# Patient Record
Sex: Male | Born: 2000 | Race: White | Hispanic: No | Marital: Single | Smoking: Current every day smoker
Health system: Southern US, Community
[De-identification: ages and names within clinical notes are randomized; demographics above are authoritative.]

---
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
## 2021-05-14 ENCOUNTER — Other Ambulatory Visit: Payer: Self-pay

## 2021-05-14 ENCOUNTER — Encounter (HOSPITAL_BASED_OUTPATIENT_CLINIC_OR_DEPARTMENT_OTHER): Payer: Self-pay | Admitting: Obstetrics and Gynecology

## 2021-05-14 ENCOUNTER — Emergency Department (HOSPITAL_BASED_OUTPATIENT_CLINIC_OR_DEPARTMENT_OTHER): Payer: 59 | Admitting: Radiology

## 2021-05-14 ENCOUNTER — Emergency Department (HOSPITAL_BASED_OUTPATIENT_CLINIC_OR_DEPARTMENT_OTHER)
Admission: EM | Admit: 2021-05-14 | Discharge: 2021-05-14 | Disposition: A | Discharge status: Home / Self Care | Payer: 59 | Attending: Emergency Medicine | Admitting: Emergency Medicine

## 2021-05-14 DIAGNOSIS — S93402A Sprain of unspecified ligament of left ankle, initial encounter: Secondary | ICD-10-CM

## 2021-05-14 DIAGNOSIS — F17290 Nicotine dependence, other tobacco product, uncomplicated: Secondary | ICD-10-CM | POA: Insufficient documentation

## 2021-05-14 DIAGNOSIS — S42002A Fracture of unspecified part of left clavicle, initial encounter for closed fracture: Secondary | ICD-10-CM

## 2021-05-14 DIAGNOSIS — S42022A Displaced fracture of shaft of left clavicle, initial encounter for closed fracture: Secondary | ICD-10-CM | POA: Diagnosis not present

## 2021-05-14 DIAGNOSIS — S99912A Unspecified injury of left ankle, initial encounter: Secondary | ICD-10-CM | POA: Diagnosis not present

## 2021-05-14 DIAGNOSIS — S4992XA Unspecified injury of left shoulder and upper arm, initial encounter: Secondary | ICD-10-CM | POA: Diagnosis present

## 2021-05-14 MED ORDER — HYDROCODONE-ACETAMINOPHEN 5-325 MG PO TABS
2.0000 | ORAL_TABLET | Freq: Once | ORAL | Status: AC
  Administered 2021-05-14: 2 via ORAL
  Filled 2021-05-14: qty 2

## 2021-05-14 MED ORDER — HYDROCODONE-ACETAMINOPHEN 7.5-325 MG PO TABS: 1.0000 | ORAL_TABLET | Freq: Four times a day (QID) | ORAL | 0 refills | Status: DC | PRN

## 2021-05-14 MED ORDER — HYDROCODONE-ACETAMINOPHEN 7.5-325 MG PO TABS
1.0000 | ORAL_TABLET | Freq: Once | ORAL | Status: DC
  Filled 2021-05-14: qty 1
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
## 2021-05-16 ENCOUNTER — Ambulatory Visit (INDEPENDENT_AMBULATORY_CARE_PROVIDER_SITE_OTHER): Payer: 59 | Admitting: Orthopaedic Surgery

## 2021-05-16 ENCOUNTER — Other Ambulatory Visit: Payer: Self-pay

## 2021-05-16 VITALS — Ht 74.0 in | Wt 260.0 lb

## 2021-05-16 DIAGNOSIS — S42022A Displaced fracture of shaft of left clavicle, initial encounter for closed fracture: Secondary | ICD-10-CM | POA: Diagnosis not present
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
## 2021-05-19 ENCOUNTER — Telehealth: Payer: Self-pay | Admitting: Orthopaedic Surgery

## 2021-05-19 ENCOUNTER — Other Ambulatory Visit: Payer: Self-pay | Admitting: Physician Assistant

## 2021-05-19 MED ORDER — HYDROCODONE-ACETAMINOPHEN 7.5-325 MG PO TABS: 1.0000 | ORAL_TABLET | Freq: Four times a day (QID) | ORAL | 0 refills | Status: AC | PRN

## 2021-05-19 NOTE — Telephone Encounter (Signed)
Sent in refill.  Please have them call the hospital as meds taken day of surgery are up to anesthesia

## 2021-05-19 NOTE — Telephone Encounter (Signed)
Patient's father Arlys John called advised patient need Rx refilled Hydrocodone. Arlys John said patient has 3 tabs left.  Arlys John asked if patient can take Hydrocodone the day of the surgery? The number to contact brian is [phone]

## 2021-05-20 ENCOUNTER — Other Ambulatory Visit: Payer: Self-pay

## 2021-05-20 ENCOUNTER — Encounter (HOSPITAL_COMMUNITY): Payer: Self-pay | Admitting: Orthopaedic Surgery

## 2021-05-20 DIAGNOSIS — S42022A Displaced fracture of shaft of left clavicle, initial encounter for closed fracture: Secondary | ICD-10-CM
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
## 2021-05-20 NOTE — Telephone Encounter (Signed)
Patients dad aware. States no one has called him from Hospital. SU is tomorrow. Can you help with this.

## 2021-05-21 ENCOUNTER — Ambulatory Visit: Payer: 59 | Admitting: Surgery

## 2021-05-21 ENCOUNTER — Ambulatory Visit (HOSPITAL_COMMUNITY): Payer: 59 | Admitting: Vascular Surgery

## 2021-05-21 ENCOUNTER — Encounter (HOSPITAL_COMMUNITY)
Admission: RE | Disposition: A | Discharge status: Home / Self Care | Payer: Self-pay | Source: Home / Self Care | Attending: Orthopaedic Surgery

## 2021-05-21 ENCOUNTER — Ambulatory Visit (HOSPITAL_COMMUNITY)
Admission: RE | Admit: 2021-05-21 | Discharge: 2021-05-21 | Disposition: A | Discharge status: Home / Self Care | Payer: 59 | Attending: Orthopaedic Surgery | Admitting: Orthopaedic Surgery

## 2021-05-21 ENCOUNTER — Ambulatory Visit (HOSPITAL_COMMUNITY): Payer: 59

## 2021-05-21 ENCOUNTER — Encounter (HOSPITAL_COMMUNITY): Payer: Self-pay | Admitting: Orthopaedic Surgery

## 2021-05-21 DIAGNOSIS — S42022A Displaced fracture of shaft of left clavicle, initial encounter for closed fracture: Secondary | ICD-10-CM | POA: Diagnosis not present

## 2021-05-21 DIAGNOSIS — F17290 Nicotine dependence, other tobacco product, uncomplicated: Secondary | ICD-10-CM | POA: Insufficient documentation

## 2021-05-21 DIAGNOSIS — X58XXXA Exposure to other specified factors, initial encounter: Secondary | ICD-10-CM | POA: Insufficient documentation

## 2021-05-21 DIAGNOSIS — S42002A Fracture of unspecified part of left clavicle, initial encounter for closed fracture: Secondary | ICD-10-CM | POA: Insufficient documentation

## 2021-05-21 DIAGNOSIS — Z419 Encounter for procedure for purposes other than remedying health state, unspecified: Secondary | ICD-10-CM

## 2021-05-21 HISTORY — PX: ORIF CLAVICULAR FRACTURE: SHX5055

## 2021-05-21 LAB — COMPREHENSIVE METABOLIC PANEL
ALT: 101 U/L — ABNORMAL HIGH (ref 0–44)
AST: 56 U/L — ABNORMAL HIGH (ref 15–41)
Albumin: 4 g/dL (ref 3.5–5.0)
Alkaline Phosphatase: 49 U/L (ref 38–126)
Anion gap: 9 (ref 5–15)
BUN: 17 mg/dL (ref 6–20)
CO2: 24 mmol/L (ref 22–32)
Calcium: 9.7 mg/dL (ref 8.9–10.3)
Chloride: 100 mmol/L (ref 98–111)
Creatinine, Ser: 0.74 mg/dL (ref 0.61–1.24)
GFR, Estimated: >60 mL/min (ref >60)
Glucose, Bld: 81 mg/dL (ref 70–99)
Potassium: 4.4 mmol/L (ref 3.5–5.1)
Sodium: 133 mmol/L — ABNORMAL LOW (ref 135–145)
Total Bilirubin: 1 mg/dL (ref 0.3–1.2)
Total Protein: 7.2 g/dL (ref 6.5–8.1)

## 2021-05-21 LAB — CBC
HCT: 41.4 % (ref 39.0–52.0)
Hemoglobin: 14.2 g/dL (ref 13.0–17.0)
MCH: 30.5 pg (ref 26.0–34.0)
MCHC: 34.3 g/dL (ref 30.0–36.0)
MCV: 88.8 fL (ref 80.0–100.0)
Platelets: 282 10*3/uL (ref 150–400)
RBC: 4.66 MIL/uL (ref 4.22–5.81)
RDW: 12 % (ref 11.5–15.5)
WBC: 7.7 10*3/uL (ref 4.0–10.5)
nRBC: 0 % (ref 0.0–0.2)

## 2021-05-21 SURGERY — OPEN REDUCTION INTERNAL FIXATION (ORIF) CLAVICULAR FRACTURE
Anesthesia: General | Laterality: Left

## 2021-05-21 MED ORDER — CEFAZOLIN SODIUM-DEXTROSE 2-4 GM/100ML-% IV SOLN
2.0000 g | INTRAVENOUS | Status: AC
  Administered 2021-05-21: 2 g via INTRAVENOUS
  Filled 2021-05-21: qty 100

## 2021-05-21 MED ORDER — ROCURONIUM BROMIDE 10 MG/ML (PF) SYRINGE
PREFILLED_SYRINGE | INTRAVENOUS | Status: DC | PRN
  Administered 2021-05-21: 100 mg via INTRAVENOUS

## 2021-05-21 MED ORDER — PHENYLEPHRINE 40 MCG/ML (10ML) SYRINGE FOR IV PUSH (FOR BLOOD PRESSURE SUPPORT)
PREFILLED_SYRINGE | INTRAVENOUS | Status: DC | PRN
  Administered 2021-05-21: 80 ug via INTRAVENOUS

## 2021-05-21 MED ORDER — VANCOMYCIN HCL 500 MG IV SOLR
INTRAVENOUS | Status: DC | PRN
  Administered 2021-05-21: 500 mg

## 2021-05-21 MED ORDER — ORAL CARE MOUTH RINSE: 15.0000 mL | Freq: Once | OROMUCOSAL | Status: AC

## 2021-05-21 MED ORDER — LACTATED RINGERS IV SOLN: INTRAVENOUS | Status: DC

## 2021-05-21 MED ORDER — ACETAMINOPHEN 10 MG/ML IV SOLN
INTRAVENOUS | Status: DC | PRN
  Administered 2021-05-21: 1000 mg via INTRAVENOUS

## 2021-05-21 MED ORDER — PROPOFOL 10 MG/ML IV BOLUS
INTRAVENOUS | Status: DC | PRN
  Administered 2021-05-21: 200 mg via INTRAVENOUS

## 2021-05-21 MED ORDER — 0.9 % SODIUM CHLORIDE (POUR BTL) OPTIME
TOPICAL | Status: DC | PRN
  Administered 2021-05-21: 1000 mL

## 2021-05-21 MED ORDER — ONDANSETRON HCL 4 MG/2ML IJ SOLN
INTRAMUSCULAR | Status: DC | PRN
  Administered 2021-05-21: 4 mg via INTRAVENOUS

## 2021-05-21 MED ORDER — DEXMEDETOMIDINE (PRECEDEX) IN NS 20 MCG/5ML (4 MCG/ML) IV SYRINGE
PREFILLED_SYRINGE | INTRAVENOUS | Status: DC | PRN
  Administered 2021-05-21: 8 ug via INTRAVENOUS
  Administered 2021-05-21: 4 ug via INTRAVENOUS
  Administered 2021-05-21 (×2): 8 ug via INTRAVENOUS
  Administered 2021-05-21: 4 ug via INTRAVENOUS
  Administered 2021-05-21: 8 ug via INTRAVENOUS

## 2021-05-21 MED ORDER — ONDANSETRON HCL 4 MG/2ML IJ SOLN
INTRAMUSCULAR | Status: AC
  Filled 2021-05-21: qty 2

## 2021-05-21 MED ORDER — ONDANSETRON HCL 4 MG/2ML IJ SOLN
INTRAMUSCULAR | Status: AC
  Filled 2021-05-21: qty 4

## 2021-05-21 MED ORDER — ACETAMINOPHEN 500 MG PO TABS: 1000.0000 mg | ORAL_TABLET | Freq: Once | ORAL | Status: DC

## 2021-05-21 MED ORDER — SUGAMMADEX SODIUM 200 MG/2ML IV SOLN
INTRAVENOUS | Status: DC | PRN
  Administered 2021-05-21: 250 mg via INTRAVENOUS

## 2021-05-21 MED ORDER — CHLORHEXIDINE GLUCONATE 0.12 % MT SOLN
OROMUCOSAL | Status: AC
  Administered 2021-05-21: 15 mL via OROMUCOSAL
  Filled 2021-05-21: qty 15

## 2021-05-21 MED ORDER — LIDOCAINE 2% (20 MG/ML) 5 ML SYRINGE
INTRAMUSCULAR | Status: AC
  Filled 2021-05-21: qty 10

## 2021-05-21 MED ORDER — DEXAMETHASONE SODIUM PHOSPHATE 10 MG/ML IJ SOLN
INTRAMUSCULAR | Status: AC
  Filled 2021-05-21: qty 3

## 2021-05-21 MED ORDER — ACETAMINOPHEN 10 MG/ML IV SOLN
INTRAVENOUS | Status: AC
  Filled 2021-05-21: qty 100

## 2021-05-21 MED ORDER — PROPOFOL 10 MG/ML IV BOLUS
INTRAVENOUS | Status: AC
  Filled 2021-05-21: qty 40

## 2021-05-21 MED ORDER — DIPHENHYDRAMINE HCL 50 MG/ML IJ SOLN
INTRAMUSCULAR | Status: DC | PRN
  Administered 2021-05-21: 12.5 mg via INTRAVENOUS

## 2021-05-21 MED ORDER — LIDOCAINE 2% (20 MG/ML) 5 ML SYRINGE
INTRAMUSCULAR | Status: DC | PRN
  Administered 2021-05-21: 100 mg via INTRAVENOUS

## 2021-05-21 MED ORDER — DEXAMETHASONE SODIUM PHOSPHATE 10 MG/ML IJ SOLN
INTRAMUSCULAR | Status: DC | PRN
  Administered 2021-05-21: 10 mg via INTRAVENOUS

## 2021-05-21 MED ORDER — KETAMINE HCL 10 MG/ML IJ SOLN
INTRAMUSCULAR | Status: DC | PRN
  Administered 2021-05-21: 20 mg via INTRAVENOUS

## 2021-05-21 MED ORDER — OXYCODONE-ACETAMINOPHEN 5-325 MG PO TABS: 1.0000 | ORAL_TABLET | Freq: Three times a day (TID) | ORAL | 0 refills | Status: AC | PRN

## 2021-05-21 MED ORDER — FENTANYL CITRATE (PF) 100 MCG/2ML IJ SOLN: 25.0000 ug | INTRAMUSCULAR | Status: DC | PRN

## 2021-05-21 MED ORDER — ONDANSETRON HCL 4 MG/2ML IJ SOLN: 4.0000 mg | Freq: Once | INTRAMUSCULAR | Status: DC | PRN

## 2021-05-21 MED ORDER — FENTANYL CITRATE (PF) 250 MCG/5ML IJ SOLN
INTRAMUSCULAR | Status: DC | PRN
  Administered 2021-05-21: 100 ug via INTRAVENOUS
  Administered 2021-05-21 (×3): 50 ug via INTRAVENOUS

## 2021-05-21 MED ORDER — DEXAMETHASONE SODIUM PHOSPHATE 10 MG/ML IJ SOLN
INTRAMUSCULAR | Status: AC
  Filled 2021-05-21: qty 1

## 2021-05-21 MED ORDER — OXYCODONE HCL 5 MG PO TABS: 5.0000 mg | ORAL_TABLET | Freq: Once | ORAL | Status: AC | PRN

## 2021-05-21 MED ORDER — METHOCARBAMOL 750 MG PO TABS: 750.0000 mg | ORAL_TABLET | Freq: Two times a day (BID) | ORAL | 3 refills | Status: AC | PRN

## 2021-05-21 MED ORDER — MIDAZOLAM HCL 2 MG/2ML IJ SOLN
INTRAMUSCULAR | Status: AC
  Filled 2021-05-21: qty 2

## 2021-05-21 MED ORDER — VANCOMYCIN HCL 500 MG IV SOLR
INTRAVENOUS | Status: AC
  Filled 2021-05-21: qty 500

## 2021-05-21 MED ORDER — LIDOCAINE 2% (20 MG/ML) 5 ML SYRINGE
INTRAMUSCULAR | Status: AC
  Filled 2021-05-21: qty 5

## 2021-05-21 MED ORDER — OXYCODONE HCL 5 MG PO TABS
ORAL_TABLET | ORAL | Status: AC
  Administered 2021-05-21: 5 mg via ORAL
  Filled 2021-05-21: qty 1

## 2021-05-21 MED ORDER — BUPIVACAINE-EPINEPHRINE 0.25% -1:200000 IJ SOLN
INTRAMUSCULAR | Status: DC | PRN
  Administered 2021-05-21: 30 mL

## 2021-05-21 MED ORDER — ACETAMINOPHEN 325 MG PO TABS: 325.0000 mg | ORAL_TABLET | ORAL | Status: DC | PRN

## 2021-05-21 MED ORDER — CHLORHEXIDINE GLUCONATE 0.12 % MT SOLN: 15.0000 mL | Freq: Once | OROMUCOSAL | Status: AC

## 2021-05-21 MED ORDER — OXYCODONE HCL 5 MG/5ML PO SOLN: 5.0000 mg | Freq: Once | ORAL | Status: AC | PRN

## 2021-05-21 MED ORDER — KETAMINE HCL 50 MG/5ML IJ SOSY
PREFILLED_SYRINGE | INTRAMUSCULAR | Status: AC
  Filled 2021-05-21: qty 5

## 2021-05-21 MED ORDER — ROCURONIUM BROMIDE 10 MG/ML (PF) SYRINGE
PREFILLED_SYRINGE | INTRAVENOUS | Status: AC
  Filled 2021-05-21: qty 10

## 2021-05-21 MED ORDER — ACETAMINOPHEN 160 MG/5ML PO SOLN
325.0000 mg | ORAL | Status: DC | PRN
Start: 2021-05-21 — End: 2021-05-22

## 2021-05-21 MED ORDER — MEPERIDINE HCL 25 MG/ML IJ SOLN: 6.2500 mg | INTRAMUSCULAR | Status: DC | PRN

## 2021-05-21 MED ORDER — BUPIVACAINE-EPINEPHRINE (PF) 0.25% -1:200000 IJ SOLN
INTRAMUSCULAR | Status: AC
  Filled 2021-05-21: qty 30

## 2021-05-21 MED ORDER — FENTANYL CITRATE (PF) 100 MCG/2ML IJ SOLN
INTRAMUSCULAR | Status: AC
  Administered 2021-05-21: 25 ug via INTRAVENOUS
  Filled 2021-05-21: qty 2

## 2021-05-21 MED ORDER — FENTANYL CITRATE (PF) 250 MCG/5ML IJ SOLN
INTRAMUSCULAR | Status: AC
  Filled 2021-05-21: qty 5

## 2021-05-21 MED ORDER — MIDAZOLAM HCL 5 MG/5ML IJ SOLN
INTRAMUSCULAR | Status: DC | PRN
  Administered 2021-05-21: 2 mg via INTRAVENOUS

## 2021-05-21 MED ORDER — CALCIUM CARBONATE-VITAMIN D 500-200 MG-UNIT PO TABS: 1.0000 | ORAL_TABLET | Freq: Three times a day (TID) | ORAL | 6 refills | Status: AC

## 2021-05-21 SURGICAL SUPPLY — 56 items
BAG COUNTER SPONGE SURGICOUNT (BAG) ×2 IMPLANT
BAG SPNG CNTER NS LX DISP (BAG) ×1
BAG SURGICOUNT SPONGE COUNTING (BAG) ×1
BIT DRILL CLAV ALPS 2.7X145 (BIT) ×2 IMPLANT
CLOSURE WOUND 1/2 X4 (GAUZE/BANDAGES/DRESSINGS)
COVER SURGICAL LIGHT HANDLE (MISCELLANEOUS) ×3 IMPLANT
DRAPE C-ARM 42X72 X-RAY (DRAPES) ×3 IMPLANT
DRAPE U-SHAPE 47X51 STRL (DRAPES) ×3 IMPLANT
DRAPE UNIVERSAL PACK (DRAPES) ×3 IMPLANT
DRSG OPSITE POSTOP 4X6 (GAUZE/BANDAGES/DRESSINGS) ×2 IMPLANT
DRSG TEGADERM 4X4.75 (GAUZE/BANDAGES/DRESSINGS) ×2 IMPLANT
DURAPREP 26ML APPLICATOR (WOUND CARE) ×6 IMPLANT
ELECT CAUTERY BLADE 6.4 (BLADE) ×3 IMPLANT
ELECT REM PT RETURN 9FT ADLT (ELECTROSURGICAL) ×3
ELECTRODE REM PT RTRN 9FT ADLT (ELECTROSURGICAL) ×1 IMPLANT
GAUZE SPONGE 4X4 12PLY STRL (GAUZE/BANDAGES/DRESSINGS) ×2 IMPLANT
GAUZE XEROFORM 1X8 LF (GAUZE/BANDAGES/DRESSINGS) ×3 IMPLANT
GLOVE SURG LTX SZ7 (GLOVE) ×3 IMPLANT
GLOVE SURG NEOP MICRO LF SZ7.5 (GLOVE) ×6 IMPLANT
GLOVE SURG PROTEXIS BL SZ6.5 (GLOVE) ×9 IMPLANT
GLOVE SURG SYN 6.5 PF PI BL (GLOVE) IMPLANT
GLOVE SURG SYN 7.5  E (GLOVE) ×6
GLOVE SURG SYN 7.5 E (GLOVE) ×2 IMPLANT
GLOVE SURG SYN 7.5 PF PI (GLOVE) IMPLANT
GLOVE SURG UNDER POLY LF SZ6.5 (GLOVE) ×4 IMPLANT
GLOVE SURG UNDER POLY LF SZ7 (GLOVE) ×3 IMPLANT
GOWN STRL REIN XL XLG (GOWN DISPOSABLE) ×6 IMPLANT
K-WIRE TROCHAR TIP ALPS 1.6 (WIRE) ×6
KIT BASIN OR (CUSTOM PROCEDURE TRAY) ×3 IMPLANT
KIT TURNOVER KIT B (KITS) ×3 IMPLANT
KWIRE TROCHAR TIP ALPS 1.6 (WIRE) IMPLANT
MANIFOLD NEPTUNE II (INSTRUMENTS) ×3 IMPLANT
NDL HYPO 25GX1X1/2 BEV (NEEDLE) IMPLANT
NEEDLE HYPO 25GX1X1/2 BEV (NEEDLE) IMPLANT
NS IRRIG 1000ML POUR BTL (IV SOLUTION) ×3 IMPLANT
PACK SHOULDER (CUSTOM PROCEDURE TRAY) ×3 IMPLANT
PAD ARMBOARD 7.5X6 YLW CONV (MISCELLANEOUS) ×6 IMPLANT
PLATE CLAV SUP LT 10H 110MM NS (Plate) ×2 IMPLANT
SCREW CORT LP 3.5X12 (Screw) ×2 IMPLANT
SCREW CORT LP 3.5X14 (Screw) ×6 IMPLANT
SCREW CORT LP T15 3.5X16 (Screw) ×3 IMPLANT
SCREW LOCK CORT STAR 3.5X14 (Screw) ×3 IMPLANT
SCREW TIS LP 3.5X18 NS (Screw) ×4 IMPLANT
SLING ARM IMMOBILIZER XL (CAST SUPPLIES) ×2 IMPLANT
STRIP CLOSURE SKIN 1/2X4 (GAUZE/BANDAGES/DRESSINGS) ×1 IMPLANT
SUCTION FRAZIER HANDLE 10FR (MISCELLANEOUS) ×3
SUCTION TUBE FRAZIER 10FR DISP (MISCELLANEOUS) ×1 IMPLANT
SUT ETHILON 3 0 FSL (SUTURE) ×2 IMPLANT
SUT ETHILON 3 0 FSLX (SUTURE) ×2 IMPLANT
SUT MNCRL AB 4-0 PS2 18 (SUTURE) ×3 IMPLANT
SUT VIC AB 0 CTX 36 (SUTURE) ×3
SUT VIC AB 0 CTX36XBRD ANTBCTR (SUTURE) IMPLANT
SUT VIC AB 2-0 CT1 27 (SUTURE) ×6
SUT VIC AB 2-0 CT1 TAPERPNT 27 (SUTURE) ×2 IMPLANT
SYR CONTROL 10ML LL (SYRINGE) IMPLANT
UNDERPAD 30X36 HEAVY ABSORB (UNDERPADS AND DIAPERS) ×3 IMPLANT

## 2021-05-21 NOTE — Transfer of Care (Signed)
Immediate Anesthesia Transfer of Care Note  Patient: Austin Mills  Procedure(s) Performed: OPEN REDUCTION INTERNAL FIXATION (ORIF) LEFT CLAVICULAR FRACTURE (Left)  Patient Location: PACU  Anesthesia Type:General  Level of Consciousness: drowsy  Airway & Oxygen Therapy: Patient Spontanous Breathing and Patient connected to face mask oxygen  Post-op Assessment: Report given to RN and Post -op Vital signs reviewed and stable  Post vital signs: Reviewed and stable  Last Vitals:  Vitals Value Taken Time  BP 130/54 05/21/21 1758  Temp    Pulse 86 05/21/21 1803  Resp 18 05/21/21 1803  SpO2 100 % 05/21/21 1803  Vitals shown include unvalidated device data.  Last Pain:  Vitals:   05/21/21 1302  TempSrc:   PainSc: 4       Patients Stated Pain Goal: 2 (05/21/21 1302)  Complications: No notable events documented.

## 2021-05-21 NOTE — H&P (Signed)
    PREOPERATIVE H&P  Chief Complaint: left clavicle fracture  HPI: Austin Mills is a 20 y.o. male who presents for surgical treatment of left clavicle fracture.  He denies any changes in medical history.  No past medical history on file. Past Surgical History:  Procedure Laterality Date   WISDOM TOOTH EXTRACTION Bilateral    Social History   Socioeconomic History   Marital status: Single    Spouse name: Not on file   Number of children: Not on file   Years of education: Not on file   Highest education level: Not on file  Occupational History   Not on file  Tobacco Use   Smoking status: Every Day    Pack years: 0.00    Types: E-cigarettes    Passive exposure: Past   Smokeless tobacco: Current  Vaping Use   Vaping Use: Every day   Substances: Nicotine, Flavoring  Substance and Sexual Activity   Alcohol use: Yes    Comment: Social- not often   Drug use: Not Currently    Types: Marijuana   Sexual activity: Yes  Other Topics Concern   Not on file  Social History Narrative   Not on file   Social Determinants of Health   Financial Resource Strain: Not on file  Food Insecurity: Not on file  Transportation Needs: Not on file  Physical Activity: Not on file  Stress: Not on file  Social Connections: Not on file   No family history on file. No Known Allergies Prior to Admission medications   Medication Sig Start Date End Date Taking? Authorizing Provider  ibuprofen (ADVIL) 200 MG tablet Take 600 mg by mouth every 8 (eight) hours as needed (pain/swelling).   Yes [provider]  HYDROcodone-acetaminophen (NORCO) 7.5-325 MG tablet Take 1 tablet by mouth every 6 (six) hours as needed for up to 5 days for moderate pain. 05/19/21 05/24/21  Cristie Hem, PA-C     Positive ROS: All other systems have been reviewed and were otherwise negative with the exception of those mentioned in the HPI and as above.  Physical Exam: General: Alert, no acute  distress Cardiovascular: No pedal edema Respiratory: No cyanosis, no use of accessory musculature GI: abdomen soft Skin: No lesions in the area of chief complaint Neurologic: Sensation intact distally Psychiatric: Patient is competent for consent with normal mood and affect Lymphatic: no lymphedema  MUSCULOSKELETAL: exam stable  Assessment: left clavicle fracture  Plan: Plan for Procedure(s): OPEN REDUCTION INTERNAL FIXATION (ORIF) LEFT CLAVICULAR FRACTURE  The risks benefits and alternatives were discussed with the patient including but not limited to the risks of nonoperative treatment, versus surgical intervention including infection, bleeding, nerve injury,  blood clots, cardiopulmonary complications, morbidity, mortality, among others, and they were willing to proceed.   Preoperative templating of the joint replacement has been completed, documented, and submitted to the Operating Room personnel in order to optimize intra-operative equipment management.   Glee Arvin, MD 05/21/2021 12:25 PM

## 2021-05-21 NOTE — Anesthesia Postprocedure Evaluation (Signed)
Anesthesia Post Note  Patient: Austin Mills  Procedure(s) Performed: OPEN REDUCTION INTERNAL FIXATION (ORIF) LEFT CLAVICULAR FRACTURE (Left)     Patient location during evaluation: PACU Anesthesia Type: General Level of consciousness: awake and alert Pain management: pain level controlled Vital Signs Assessment: post-procedure vital signs reviewed and stable Respiratory status: spontaneous breathing, nonlabored ventilation, respiratory function stable and patient connected to nasal cannula oxygen Cardiovascular status: blood pressure returned to baseline and stable Postop Assessment: no apparent nausea or vomiting Anesthetic complications: no   No notable events documented.  Last Vitals:  Vitals:   05/21/21 1815 05/21/21 1830  BP: 132/72 (!) 144/85  Pulse: (!) 52 63  Resp: 12 20  Temp:  36.6 C  SpO2: 96% 99%    Last Pain:  Vitals:   05/21/21 1840  TempSrc:   PainSc: 5                  Treyven Lafauci COKER
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
## 2021-05-21 NOTE — Anesthesia Preprocedure Evaluation (Addendum)
Anesthesia Evaluation  Patient identified by MRN, date of birth, ID band Patient awake    Reviewed: Allergy & Precautions, NPO status , Patient's Chart, lab work & pertinent test results  Airway Mallampati: III  TM Distance: >3 FB Neck ROM: Full  Mouth opening: Limited Mouth Opening  Dental no notable dental hx. (+) Teeth Intact, Dental Advisory Given   Pulmonary neg pulmonary ROS, Current SmokerPatient did not abstain from smoking.,    Pulmonary exam normal breath sounds clear to auscultation       Cardiovascular negative cardio ROS Normal cardiovascular exam Rhythm:Regular Rate:Normal     Neuro/Psych negative neurological ROS  negative psych ROS   GI/Hepatic negative GI ROS, Neg liver ROS,   Endo/Other  negative endocrine ROS  Renal/GU negative Renal ROS  negative genitourinary   Musculoskeletal negative musculoskeletal ROS (+)   Abdominal   Peds  Hematology negative hematology ROS (+)   Anesthesia Other Findings   Reproductive/Obstetrics                            Anesthesia Physical Anesthesia Plan  ASA: 2  Anesthesia Plan: General   Post-op Pain Management:    Induction: Intravenous  PONV Risk Score and Plan: 1 and Midazolam, Dexamethasone and Ondansetron  Airway Management Planned: Oral ETT  Additional Equipment:   Intra-op Plan:   Post-operative Plan: Extubation in OR  Informed Consent: I have reviewed the patients History and Physical, chart, labs and discussed the procedure including the risks, benefits and alternatives for the proposed anesthesia with the patient or authorized representative who has indicated his/her understanding and acceptance.     Dental advisory given  Plan Discussed with: CRNA  Anesthesia Plan Comments:         Anesthesia Quick Evaluation

## 2021-05-21 NOTE — Discharge Instructions (Signed)
   Postoperative instructions:  Weightbearing instructions: non weight bearing.  Sling for comfort  Dressing instructions: Keep your dressing and/or splint clean and dry at all times.  It will be removed at your first post-operative appointment.  Your stitches and/or staples will be removed at this visit.  Incision instructions:  Do not soak your incision for 3 weeks after surgery.  If the incision gets wet, pat dry and do not scrub the incision.  Pain control:  You have been given a prescription to be taken as directed for post-operative pain control.  In addition, elevate the operative extremity above the heart at all times to prevent swelling and throbbing pain.  Take over-the-counter Colace, 100mg  by mouth twice a day while taking narcotic pain medications to help prevent constipation.  Follow up appointments: 1) 14 days for suture removal and wound check. 2) Dr. as scheduled.   -------------------------------------------------------------------------------------------------------------  After Surgery Pain Control:  After your surgery, post-surgical discomfort or pain is likely. This discomfort can last several days to a few weeks. At certain times of the day your discomfort may be more intense.  Did you receive a nerve block?  A nerve block can provide pain relief for one hour to two days after your surgery. As long as the nerve block is working, you will experience little or no sensation in the area the surgeon operated on.  As the nerve block wears off, you will begin to experience pain or discomfort. It is very important that you begin taking your prescribed pain medication before the nerve block fully wears off. Treating your pain at the first sign of the block wearing off will ensure your pain is better controlled and more tolerable when full-sensation returns. Do not wait until the pain is intolerable, as the medicine will be less effective. It is better to treat pain in advance  than to try and catch up.  General Anesthesia:  If you did not receive a nerve block during your surgery, you will need to start taking your pain medication shortly after your surgery and should continue to do so as prescribed by your surgeon.  Pain Medication:  Most commonly we prescribe Vicodin and Percocet for post-operative pain. Both of these medications contain a combination of acetaminophen (Tylenol) and a narcotic to help control pain.   It takes between 30 and 45 minutes before pain medication starts to work. It is important to take your medication before your pain level gets too intense.   Nausea is a common side effect of many pain medications. You will want to eat something before taking your pain medicine to help prevent nausea.   If you are taking a prescription pain medication that contains acetaminophen, we recommend that you do not take additional over the counter acetaminophen (Tylenol).  Other pain relieving options:   Using a cold pack to ice the affected area a few times a day (15 to 20 minutes at a time) can help to relieve pain, reduce swelling and bruising.   Elevation of the affected area can also help to reduce pain and swelling.

## 2021-05-21 NOTE — Anesthesia Procedure Notes (Signed)
Procedure Name: Intubation Date/Time: 05/21/2021 3:51 PM Performed by: Janene Harvey, CRNA Pre-anesthesia Checklist: Patient identified, Emergency Drugs available, Suction available and Patient being monitored Patient Re-evaluated:Patient Re-evaluated prior to induction Oxygen Delivery Method: Circle system utilized Preoxygenation: Pre-oxygenation with 100% oxygen Induction Type: IV induction Ventilation: Mask ventilation without difficulty Laryngoscope Size: Mac and 4 Grade View: Grade I Tube type: Oral Tube size: 7.5 mm Number of attempts: 1 Airway Equipment and Method: Stylet and Oral airway Placement Confirmation: ETT inserted through vocal cords under direct vision, positive ETCO2 and breath sounds checked- equal and bilateral Secured at: 23 cm Tube secured with: Tape Dental Injury: Teeth and Oropharynx as per pre-operative assessment

## 2021-05-23 ENCOUNTER — Encounter (HOSPITAL_COMMUNITY): Payer: Self-pay | Admitting: Orthopaedic Surgery

## 2021-06-04 ENCOUNTER — Encounter: Payer: Self-pay | Admitting: Orthopaedic Surgery

## 2021-06-04 ENCOUNTER — Ambulatory Visit (INDEPENDENT_AMBULATORY_CARE_PROVIDER_SITE_OTHER): Payer: 59 | Admitting: Orthopaedic Surgery

## 2021-06-04 ENCOUNTER — Other Ambulatory Visit: Payer: Self-pay

## 2021-06-04 ENCOUNTER — Ambulatory Visit (INDEPENDENT_AMBULATORY_CARE_PROVIDER_SITE_OTHER): Payer: 59

## 2021-06-04 DIAGNOSIS — S42022A Displaced fracture of shaft of left clavicle, initial encounter for closed fracture: Secondary | ICD-10-CM | POA: Diagnosis not present

## 2021-06-04 NOTE — Progress Notes (Signed)
   Post-Op Visit Note   Patient: Austin Mills           Date of Birth: 2001/07/30           MRN: [id number] Visit Date: 06/04/2021 PCP: Pcp, No   Assessment & Plan:  Chief Complaint:  Chief Complaint  Patient presents with   Left Shoulder - Routine Post Op   Visit Diagnoses:  1. Displaced fracture of shaft of left clavicle, initial encounter for closed fracture     Plan: Austin Mills is a 2-week status post ORIF left clavicle fracture.  He took about 4 days of pain medications but since then he has been doing really well.  He has been compliant with restrictions.  He has some numbness around the surgical incision.  Surgical incision is healed without any signs of infection.  There is no drainage.  No neurovascular compromise distally.  X-rays demonstrate stable fixation alignment of the fracture.  Sutures removed Steri-Strips placed.  He will continue the same activity restrictions of just gentle range of motion up to the level of the shoulder for the next month.  Pendulums 3-4 times a day.  Continue to take the Os-Cal supplements.  Recheck in 4 weeks with two-view x-rays of the left clavicle.  Follow-Up Instructions: Return in about 4 weeks (around 07/02/2021).   Orders:  Orders Placed This Encounter  Procedures   XR Clavicle Left   No orders of the defined types were placed in this encounter.   Imaging: XR Clavicle Left  Result Date: 06/04/2021 Stable fixation alignment of clavicle fracture without any complications.   PMFS History: Patient Active Problem List   Diagnosis Date Noted   Displaced fracture of shaft of left clavicle, initial encounter for closed fracture 05/20/2021   History reviewed. No pertinent past medical history.  History reviewed. No pertinent family history.  Past Surgical History:  Procedure Laterality Date   ORIF CLAVICULAR FRACTURE Left 05/21/2021   Procedure: OPEN REDUCTION INTERNAL FIXATION (ORIF) LEFT CLAVICULAR FRACTURE;  Surgeon: Tarry Kos, MD;  Location: [facility] OR;  Service: Orthopedics;  Laterality: Left;   WISDOM TOOTH EXTRACTION Bilateral    Social History   Occupational History   Not on file  Tobacco Use   Smoking status: Every Day    Pack years: 0.00    Types: E-cigarettes    Passive exposure: Past   Smokeless tobacco: Current  Vaping Use   Vaping Use: Every day   Substances: Nicotine, Flavoring  Substance and Sexual Activity   Alcohol use: Yes    Comment: Social- not often   Drug use: Not Currently    Types: Marijuana   Sexual activity: Yes

## 2021-07-02 ENCOUNTER — Ambulatory Visit (INDEPENDENT_AMBULATORY_CARE_PROVIDER_SITE_OTHER): Payer: 59 | Admitting: Orthopaedic Surgery

## 2021-07-02 ENCOUNTER — Ambulatory Visit (INDEPENDENT_AMBULATORY_CARE_PROVIDER_SITE_OTHER): Payer: 59

## 2021-07-02 ENCOUNTER — Encounter: Payer: Self-pay | Admitting: Orthopaedic Surgery

## 2021-07-02 ENCOUNTER — Other Ambulatory Visit: Payer: Self-pay

## 2021-07-02 DIAGNOSIS — S42022A Displaced fracture of shaft of left clavicle, initial encounter for closed fracture: Secondary | ICD-10-CM

## 2021-07-02 NOTE — Progress Notes (Signed)
   Post-Op Visit Note   Patient: Austin Mills           Date of Birth: 02-17-2001           MRN: [id number] Visit Date: 07/02/2021 PCP: Pcp, No   Assessment & Plan:  Chief Complaint:  Chief Complaint  Patient presents with   LEFT CLAVICLE F/U   Visit Diagnoses:  1. Displaced fracture of shaft of left clavicle, initial encounter for closed fracture     Plan: Patient is a pleasant 20 year old gentleman who comes in today 2 weeks status post ORIF left clavicle fracture 05/21/2021.  He has been doing well.  He has been taking calcium supplementation and working on pendulum swings.  He has very little discomfort to the clavicle.  Overall, feeling good.  Examination of his left clavicle reveals no tenderness to the fracture site.  There is near full range of motion of the left shoulder.  He is neurovascular intact distally.  At this point, we would like to start him in outpatient physical therapy to work on range of motion and strengthening exercises.  No lifting greater than 5 pounds.  Hard copy for PT prescription was provided to the patient as he is in school in [place] and returns in 12 days.  He will follow-up with [place] in 6 weeks time for repeat evaluation and x-rays of the left clavicle.  Call with concerns or questions.  Follow-Up Instructions: Return in about 6 weeks (around 08/13/2021).   Orders:  Orders Placed This Encounter  Procedures   XR Clavicle Left   No orders of the defined types were placed in this encounter.   Imaging: No results found.  PMFS History: Patient Active Problem List   Diagnosis Date Noted   Displaced fracture of shaft of left clavicle, initial encounter for closed fracture 05/20/2021   History reviewed. No pertinent past medical history.  History reviewed. No pertinent family history.  Past Surgical History:  Procedure Laterality Date   ORIF CLAVICULAR FRACTURE Left 05/21/2021   Procedure: OPEN REDUCTION INTERNAL FIXATION (ORIF) LEFT  CLAVICULAR FRACTURE;  Surgeon: Tarry Kos, MD;  Location: [facility] OR;  Service: Orthopedics;  Laterality: Left;   WISDOM TOOTH EXTRACTION Bilateral    Social History   Occupational History   Not on file  Tobacco Use   Smoking status: Every Day    Types: E-cigarettes    Passive exposure: Past   Smokeless tobacco: Current  Vaping Use   Vaping Use: Every day   Substances: Nicotine, Flavoring  Substance and Sexual Activity   Alcohol use: Yes    Comment: Social- not often   Drug use: Not Currently    Types: Marijuana   Sexual activity: Yes

## 2021-07-30 ENCOUNTER — Encounter: Payer: 59 | Admitting: Orthopaedic Surgery

## 2021-08-05 ENCOUNTER — Encounter: Payer: Self-pay | Admitting: Orthopaedic Surgery

## 2021-08-05 ENCOUNTER — Ambulatory Visit (INDEPENDENT_AMBULATORY_CARE_PROVIDER_SITE_OTHER): Payer: 59 | Admitting: Orthopaedic Surgery

## 2021-08-05 ENCOUNTER — Ambulatory Visit (INDEPENDENT_AMBULATORY_CARE_PROVIDER_SITE_OTHER): Payer: 59

## 2021-08-05 ENCOUNTER — Other Ambulatory Visit: Payer: Self-pay

## 2021-08-05 DIAGNOSIS — S42022A Displaced fracture of shaft of left clavicle, initial encounter for closed fracture: Secondary | ICD-10-CM

## 2021-08-05 NOTE — Progress Notes (Signed)
   Post-Op Visit Note   Patient: Austin Mills           Date of Birth: 2001-08-19           MRN: [id number] Visit Date: 08/05/2021 PCP: Pcp, No   Assessment & Plan:  Chief Complaint:  Chief Complaint  Patient presents with   Left Shoulder - Follow-up   Visit Diagnoses:  1. Displaced fracture of shaft of left clavicle, initial encounter for closed fracture     Plan: Patient is a pleasant 20 year old gentleman who comes in today 11 weeks status post ORIF left distal clavicle fracture 05/21/2021.  He has been doing well and has been without pain.  He has been working on home exercise program where he has regained full range of motion.  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  Overall, doing very well.  Examination of the left clavicle reveals mild tenderness to the distal aspect.  He has full range of motion of the shoulder without pain.  He is neurovascular intact distally.  At this point, his fracture has not completely consolidated so we would like him to still be mindful of his activity.  He will follow-up with [place] in 2 months time for repeat evaluation and x-rays of the left clavicle.  Call with concerns or questions in the meantime.  Follow-Up Instructions: Return in about 2 months (around 10/05/2021).   Orders:  Orders Placed This Encounter  Procedures   XR Clavicle Left   No orders of the defined types were placed in this encounter.   Imaging: XR Clavicle Left  Result Date: 08/05/2021 X-rays demonstrate consolidation of the fracture site but he still exhibits a slight lucency through the proximal portion of the fracture   PMFS History: Patient Active Problem List   Diagnosis Date Noted   Displaced fracture of shaft of left clavicle, initial encounter for closed fracture 05/20/2021   History reviewed. No pertinent past medical history.  History reviewed. No pertinent family history.  Past Surgical History:  Procedure Laterality Date    ORIF CLAVICULAR FRACTURE Left 05/21/2021   Procedure: OPEN REDUCTION INTERNAL FIXATION (ORIF) LEFT CLAVICULAR FRACTURE;  Surgeon: Tarry Kos, MD;  Location: [facility] OR;  Service: Orthopedics;  Laterality: Left;   WISDOM TOOTH EXTRACTION Bilateral    Social History   Occupational History   Not on file  Tobacco Use   Smoking status: Every Day    Types: E-cigarettes    Passive exposure: Past   Smokeless tobacco: Current  Vaping Use   Vaping Use: Every day   Substances: Nicotine, Flavoring  Substance and Sexual Activity   Alcohol use: Yes    Comment: Social- not often   Drug use: Not Currently    Types: Marijuana   Sexual activity: Yes

## 2021-08-08 ENCOUNTER — Encounter: Payer: Self-pay | Admitting: Orthopaedic Surgery

## 2021-08-13 ENCOUNTER — Encounter: Payer: 59 | Admitting: Orthopaedic Surgery

## 2021-10-07 ENCOUNTER — Ambulatory Visit: Payer: 59 | Admitting: Orthopaedic Surgery
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
## 2022-07-04 IMAGING — RF DG CLAVICLE*L*
1 series · 2 of 2 positions shown · non-contrast
Comparison: 05/14/2021

CLINICAL DATA: Clavicle fracture

EXAM:
LEFT CLAVICLE - 2+ VIEWS; DG C-ARM 1-60 MIN

[Series 1: unknown protocol · 0.20mm/px · 2 of 2 slices shown]
[im 1/2]
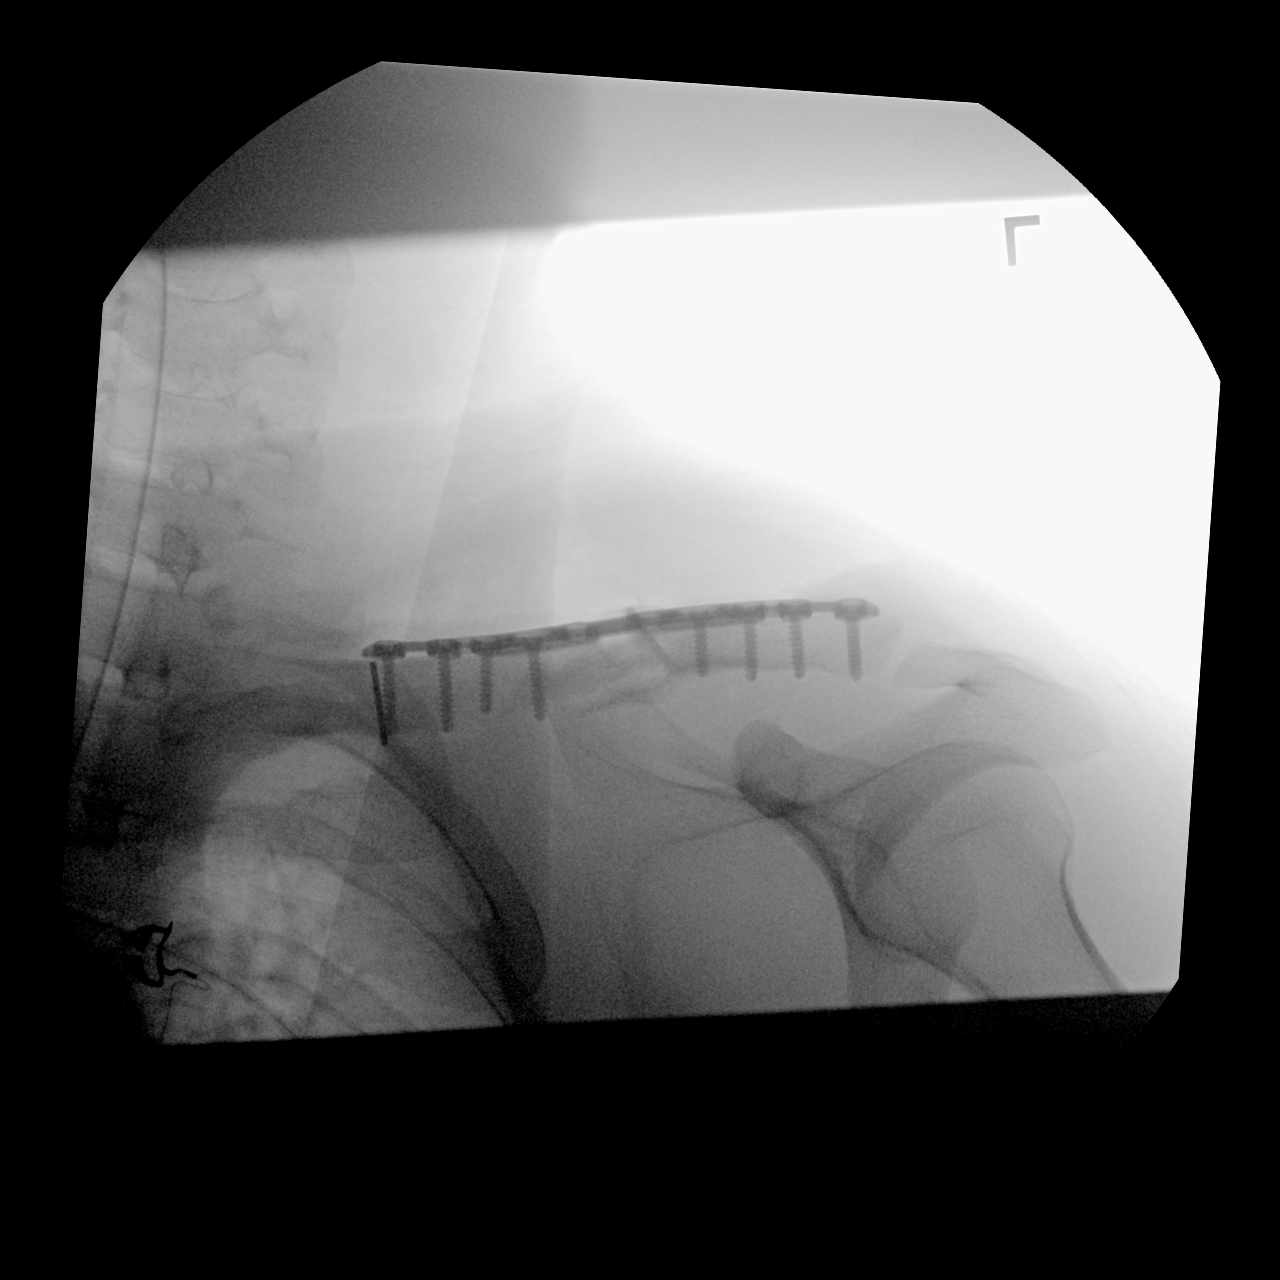
[im 2/2]
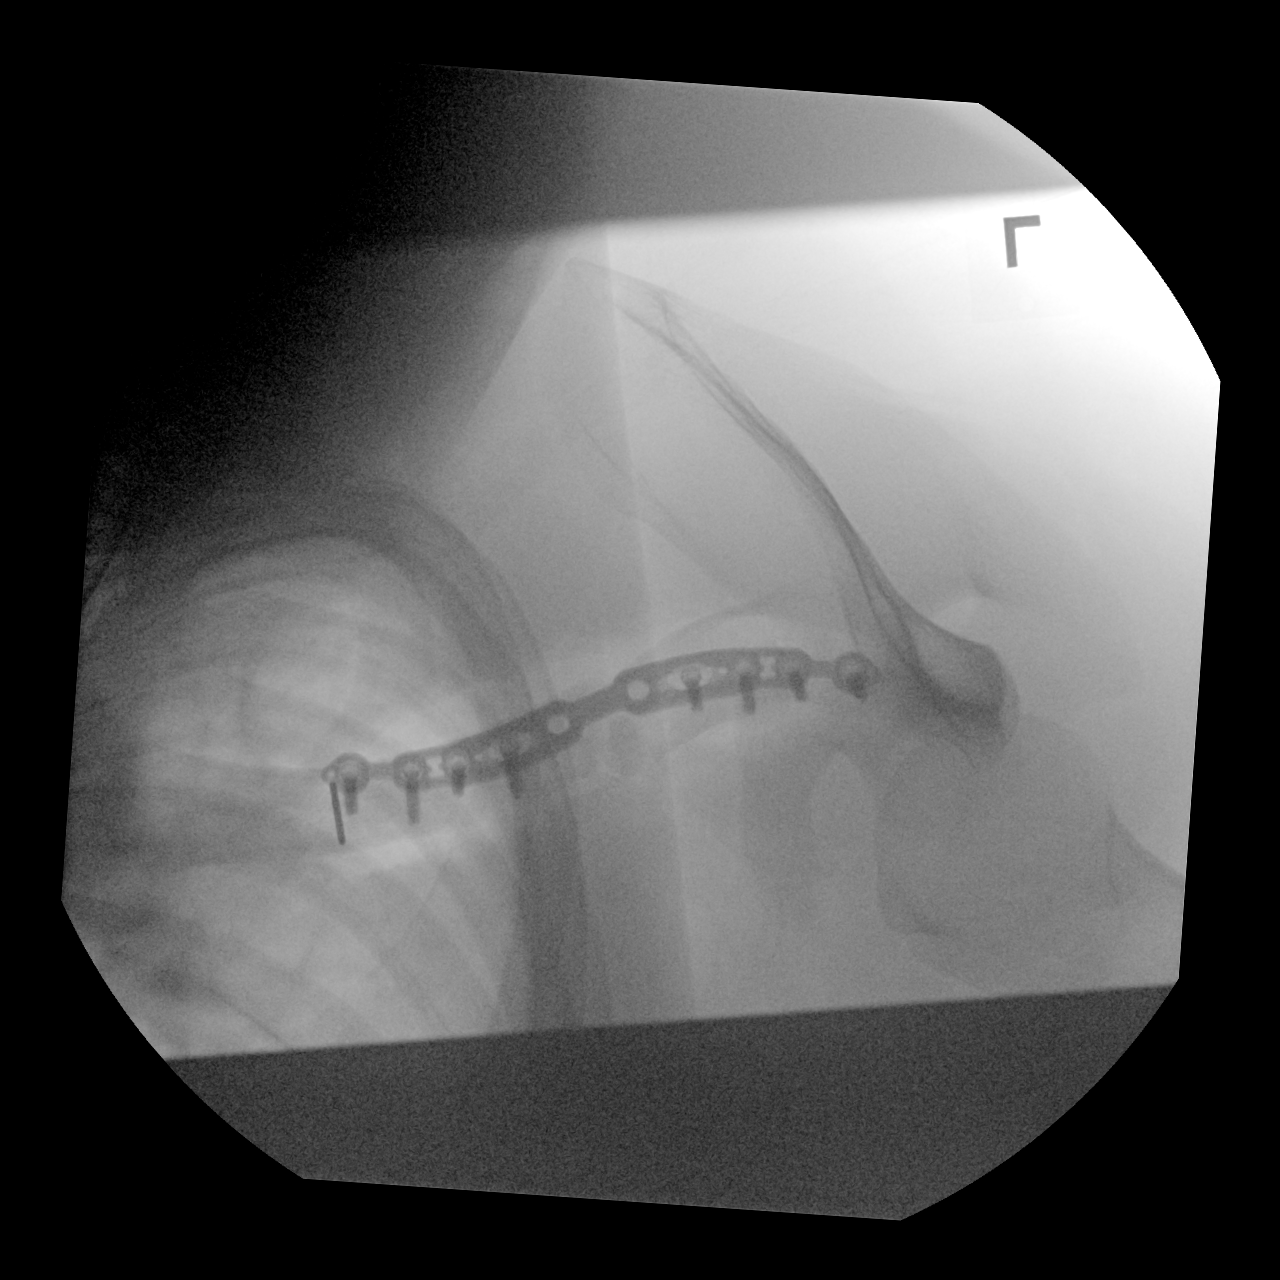

[2 of 2 positions shown; findings below may reference images not displayed]

FINDINGS: Two low resolution intraoperative spot views of the left clavicle.
Total fluoroscopy time was 12 seconds. The images demonstrate
surgical plate and multiple screw fixation of left midclavicular
fracture with anatomic alignment
IMPRESSION: Intraoperative fluoroscopic assistance provided during surgical
fixation of left clavicle fracture
